# Patient Record
Sex: Male | Born: 2000 | Race: Black or African American | Hispanic: No | Marital: Single | Smoking: Never smoker
Health system: Southern US, Community
[De-identification: ages and names within clinical notes are randomized; demographics above are authoritative.]

## PROBLEM LIST (undated history)

## (undated) DIAGNOSIS — D573 Sickle-cell trait: Secondary | ICD-10-CM

## (undated) HISTORY — PX: CIRCUMCISION: SUR203

---
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
## 2022-09-02 ENCOUNTER — Other Ambulatory Visit: Payer: Self-pay

## 2022-09-02 ENCOUNTER — Emergency Department (HOSPITAL_COMMUNITY)
Admission: EM | Admit: 2022-09-02 | Discharge: 2022-09-02 | Disposition: A | Discharge status: Home / Self Care | Payer: BC Managed Care – PPO | Attending: Emergency Medicine | Admitting: Emergency Medicine

## 2022-09-02 DIAGNOSIS — Z91010 Allergy to peanuts: Secondary | ICD-10-CM | POA: Insufficient documentation

## 2022-09-02 DIAGNOSIS — H538 Other visual disturbances: Secondary | ICD-10-CM | POA: Diagnosis present

## 2022-09-02 MED ORDER — TETRACAINE HCL 0.5 % OP SOLN
2.0000 [drp] | Freq: Once | OPHTHALMIC | Status: AC
  Administered 2022-09-02: 2 [drp] via OPHTHALMIC
  Filled 2022-09-02: qty 4

## 2022-09-02 MED ORDER — FLUORESCEIN SODIUM 1 MG OP STRP
1.0000 | ORAL_STRIP | Freq: Once | OPHTHALMIC | Status: AC
  Administered 2022-09-02: 1 via OPHTHALMIC
  Filled 2022-09-02: qty 1

## 2022-09-02 MED ORDER — PREDNISOLONE ACETATE 1 % OP SUSP: 1.0000 [drp] | Freq: Four times a day (QID) | OPHTHALMIC | 0 refills | Status: AC

## 2022-09-02 NOTE — ED Provider Notes (Addendum)
Assume Care - Medical Decision Making  Care of patient assumed from previous emergency medicine provider. See their note for further details of history, physical exam and plan.  Briefly, Dennis Becker is a 22 y.o. male who presents with: Painless blurry vision.  Started last night.  He was eating hot shears and wondered if he got something in his eye but they are uncertain.  Initially went to urgent care where they found him to have decreased visual acuity 20/100 and sent here for further evaluation.  Does not wear contacts or glasses.  Normally has normal vision.  History of sickle cell trait but no other medical history.  Visual acuity is 20/80 in the right affected eye, there is an irregular white appearance in the anterior chamber on funduscopic exam seen with slit lamp as well.  There is slight amount of cells and flare seen on cell lamp exam.  Intraocular pressure is normal.  No hypopyon or hyphema.  Consulting ophthalmology, would love to have him seen in clinic tomorrow or Monday.  Follow-up ophthalmology recommendations   Reassessment:  I personally reassessed the patient: Vital Signs:  The most current vitals were  Today's Vitals   09/02/22 1236 09/02/22 1249  BP: (!) 157/89   Pulse: 71   Resp: 17   Temp: 98.2 F (36.8 C)   TempSrc: Oral   SpO2: 99%   PainSc:  3    There is no height or weight on file to calculate BMI.  Hemodynamics:  The patient is hemodynamically stable. Mental Status:  The patient is alert   Additional MDM/ED Course: Discussed with ophthalmology, they want to see him in clinic at the start of this next week.  In the meantime, we will put him on Pred forte eyedrops.  Told him to call the clinic number should anything worsen in the time between now and Monday.  Or return to the emergency department anything changes, if pain develops, if he starts to have systemic symptoms.    Phyllis Ginger, MD 09/02/22 2536    Phyllis Ginger, MD 09/02/22  6440    Pattricia Boss, MD 09/02/22 3474

## 2022-09-02 NOTE — ED Triage Notes (Signed)
Pt here with R eye vision changes since last night. Family states pt was eating hot cheetos and may have gotten some of that in his eye.

## 2022-09-02 NOTE — ED Provider Notes (Signed)
[facility] EMERGENCY DEPARTMENT Provider Note   CSN: [id number] Arrival date & time: 09/02/22  1213     History  Chief Complaint  Patient presents with   Eye Problem    Dennis Becker is a 22 y.o. male.  With past medical history of sickle cell trait who presents to the emergency department with blurry vision.   Patient states last night around 1:30 AM he had a sudden onset of painless blurry vision in the right eye.  He states that at that time he was eating hot Cheetos and was unsure if he got debris from this in his eye.  He states that the symptoms have persisted since then.  He went to [facility] urgent care this morning and was sent here for possible vision loss.  He denies having any pain.  He denies headache or vomiting.  He denies any recent illnesses.  He denies numbness or tingling to the face or weakness to his extremities.  He does not wear contacts or glasses or have an ophthalmologist.   Eye Problem Associated symptoms: no discharge, no itching and no redness        Home Medications Prior to Admission medications   Medication Sig Start Date End Date Taking? Authorizing Provider  acetaminophen (TYLENOL) 160 MG chewable tablet Chew 480 mg every 6 (six) hours as needed by mouth for pain.    [provider]      Allergies    Egg white (egg protein), Fish allergy, Orange fruit [citrus], Peanuts [peanut oil], Shellfish allergy, and Soy allergy    Review of Systems   Review of Systems  Eyes:  Positive for visual disturbance. Negative for pain, discharge, redness and itching.  All other systems reviewed and are negative.   Physical Exam Updated Vital Signs BP (!) 157/89 (BP Location: Right Arm)   Pulse 71   Temp 98.2 F (36.8 C) (Oral)   Resp 17   SpO2 99%  Physical Exam Vitals and nursing note reviewed.  HENT:     Head: Normocephalic and atraumatic.  Eyes:     General: Lids are normal. Vision grossly intact. Gaze aligned  appropriately. No visual field deficit or scleral icterus.    Intraocular pressure: Right eye pressure is 15 mmHg. Left eye pressure is 17 mmHg. Measurements were taken using an automated tonometer.    Extraocular Movements: Extraocular movements intact.     Right eye: Normal extraocular motion.     Left eye: Normal extraocular motion.     Conjunctiva/sclera:     Right eye: Right conjunctiva is not injected. No chemosis, exudate or hemorrhage.    Pupils:     Right eye: No fluorescein uptake.     Slit lamp exam:    Right eye: Anterior chamber flares present. No corneal ulcer, foreign body, hyphema, hypopyon or photophobia.     Comments: Irregular white appearance within the anterior chamber on exam with fundoscope.   20/80 right eye  20/10 left eye  Pulmonary:     Effort: Pulmonary effort is normal. No respiratory distress.  Skin:    Findings: No rash.  Neurological:     General: No focal deficit present.     Mental Status: He is alert.  Psychiatric:        Mood and Affect: Mood normal.        Behavior: Behavior normal.        Thought Content: Thought content normal.        Judgment: Judgment  normal.    ED Results / Procedures / Treatments   Labs (all labs ordered are listed, but only abnormal results are displayed) Labs Reviewed - No data to display  EKG None  Radiology No results found.  Procedures Procedures   Medications Ordered in ED Medications  fluorescein ophthalmic strip 1 strip (1 strip Right Eye Given 09/02/22 1459)  tetracaine (PONTOCAINE) 0.5 % ophthalmic solution 2 drop (2 drops Right Eye Given 09/02/22 1459)   ED Course/ Medical Decision Making/ A&P                           Medical Decision Making Risk Prescription drug management.  Initial Impression and Ddx 41--year-old male who for this to the emergency department with right eye blurriness.  Sent from urgent care.  It is painless.  For eye exam.   Patient PMH that increases complexity of ED  encounter: Sickle cell trait  Interpretation of Diagnostics I independent reviewed and interpreted the labs as followed: Not indicated  - I independently visualized the following imaging with scope of interpretation limited to determining acute life threatening conditions related to emergency care: Not indicated  Patient Reassessment and Ultimate Disposition/Management 22 year old male who presents with right-sided eye blurriness that is painless. There is no evidence of orbital or periorbital edema on exam. The eyes not injected.  There is no chemosis or hemorrhage or proptosis. There is no ptosis, hyphema or hypopyon present. There is no visual field deficits. He is 20/80 in the right eye, 20/10 in the left eye. No elevated IOP. He does have this abnormality which appears to be in the anterior chamber.  On slit-lamp exam there is mild anterior chamber flare signal.  There is also this irregular milky white defect which appears to be in the anterior chamber. I did have the resident, Dr. Gust Brooms, evaluate the eye.  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   Care of patient handed off to Dr. Brunetta Jeans at change of shift. Pending ophthalmology call back and recommendations. Dispo pending recs.   Patient management required discussion with the following services or consulting groups:  Ophthalmology  Complexity of Problems Addressed Acute complicated illness or Injury  Additional Data Reviewed and Analyzed Further history obtained from: Further history from spouse/family member, Past medical history and medications listed in the EMR, and Care Everywhere  Patient Encounter Risk Assessment Prescriptions and Minor Procedures  Final Clinical Impression(s) / ED Diagnoses Final diagnoses:  None    Rx / DC Orders ED Discharge Orders     None         Dennis Peru, PA-C 09/02/22 1607    Margarita Grizzle, MD 09/05/22  1159

## 2022-09-02 NOTE — ED Provider Triage Note (Signed)
Emergency Medicine Provider Triage Evaluation Note  Dennis Becker , a 22 y.o. male  was evaluated in triage accompanied by his mother.  Pt complains of right eye blurriness since last night.  Before the blurriness started, was eating hot Cheetos.  Patient's mother states his significant other reported dust from the house she does got into the patient's eye right before the blurry vision started.  Denies eye pain, discharge, or direct blunt/penetrating injury.  Denies vision loss, fevers, or history of this happening prior.  Review of Systems  Positive:  Negative: See above  Physical Exam  BP (!) 157/89 (BP Location: Right Arm)   Pulse 71   Temp 98.2 F (36.8 C) (Oral)   Resp 17   SpO2 99%  Gen:   Awake, no distress   Resp:  Normal effort  MSK:   Moves extremities without difficulty  Other:  Gaze aligned appropriately.  Patient sitting comfortably.  Periorbital and eye area nontender.  PERRLA.  No injection or discharge.  No hyphema or hematoma appreciated.  Medical Decision Making  Medically screening exam initiated at 12:45 PM.  Appropriate orders placed.  Dennis Becker was informed that the remainder of the evaluation will be completed by another provider, this initial triage assessment does not replace that evaluation, and the importance of remaining in the ED until their evaluation is complete.     Prince Rome, PA-C 71/69/67 1253
